# Patient Record
Sex: Male | Born: 1995 | Race: White | Hispanic: No | Marital: Married | Smoking: Never smoker
Health system: Southern US, Community
[De-identification: ages and names within clinical notes are randomized; demographics above are authoritative.]

## PROBLEM LIST (undated history)

## (undated) DIAGNOSIS — F909 Attention-deficit hyperactivity disorder, unspecified type: Secondary | ICD-10-CM

## (undated) HISTORY — DX: Attention-deficit hyperactivity disorder, unspecified type: F90.9

## (undated) HISTORY — PX: TONSILLECTOMY: SUR1361

---
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
## 2016-01-01 HISTORY — PX: WISDOM TOOTH EXTRACTION: SHX21

## 2019-11-18 ENCOUNTER — Other Ambulatory Visit: Payer: Self-pay

## 2019-11-18 DIAGNOSIS — Z20822 Contact with and (suspected) exposure to covid-19: Secondary | ICD-10-CM

## 2019-11-20 LAB — NOVEL CORONAVIRUS, NAA: SARS-CoV-2, NAA: NOT DETECTED

## 2020-09-21 ENCOUNTER — Ambulatory Visit: Payer: Self-pay

## 2022-08-27 ENCOUNTER — Other Ambulatory Visit: Payer: Self-pay | Admitting: Family Medicine

## 2022-08-27 ENCOUNTER — Encounter: Payer: Self-pay | Admitting: Family Medicine

## 2022-08-27 ENCOUNTER — Ambulatory Visit (INDEPENDENT_AMBULATORY_CARE_PROVIDER_SITE_OTHER): Payer: Managed Care, Other (non HMO) | Admitting: Family Medicine

## 2022-08-27 VITALS — BP 124/78 | HR 67 | Ht 71.0 in | Wt 205.6 lb

## 2022-08-27 DIAGNOSIS — Z7689 Persons encountering health services in other specified circumstances: Secondary | ICD-10-CM

## 2022-08-27 DIAGNOSIS — Z1159 Encounter for screening for other viral diseases: Secondary | ICD-10-CM

## 2022-08-27 DIAGNOSIS — Z833 Family history of diabetes mellitus: Secondary | ICD-10-CM | POA: Diagnosis not present

## 2022-08-27 DIAGNOSIS — Z13220 Encounter for screening for lipoid disorders: Secondary | ICD-10-CM

## 2022-08-27 DIAGNOSIS — Z83438 Family history of other disorder of lipoprotein metabolism and other lipidemia: Secondary | ICD-10-CM | POA: Diagnosis not present

## 2022-08-27 DIAGNOSIS — Z114 Encounter for screening for human immunodeficiency virus [HIV]: Secondary | ICD-10-CM

## 2022-08-27 DIAGNOSIS — E663 Overweight: Secondary | ICD-10-CM

## 2022-08-27 DIAGNOSIS — Z0000 Encounter for general adult medical examination without abnormal findings: Secondary | ICD-10-CM

## 2022-08-27 DIAGNOSIS — Z131 Encounter for screening for diabetes mellitus: Secondary | ICD-10-CM

## 2022-08-27 DIAGNOSIS — Z8249 Family history of ischemic heart disease and other diseases of the circulatory system: Secondary | ICD-10-CM

## 2022-08-27 NOTE — Patient Instructions (Addendum)
Thank you for coming to the office today.  DUE for FASTING BLOOD WORK (no food or drink after midnight before the lab appointment, only water or coffee without cream/sugar on the morning of)  SCHEDULE "Lab Only" visit in the morning at the clinic for lab draw in 4 WEEKS  - Make sure Lab Only appointment is at about 1 week before your next appointment, so that results will be available  For Lab Results, once available within 2-3 days of blood draw, you can can log in to MyChart online to view your results and a brief explanation. Also, we can discuss results at next follow-up visit.  Please schedule a Follow-up Appointment to: Return in about 4 weeks (around 09/24/2022) for 4 weeks fasting lab only then 1 week later Annual Physical.  If you have any other questions or concerns, please feel free to call the office or send a message through MyChart. You may also schedule an earlier appointment if necessary.  Additionally, you may be receiving a survey about your experience at our office within a few days to 1 week by e-mail or mail. We value your feedback.  Saralyn Pilar, DO [facility], [place]

## 2022-08-27 NOTE — Progress Notes (Signed)
Subjective:    Patient ID: Rodney Woods, male    DOB: April 17, 1996, 26 y.o.   MRN: [id number]  Rodney Woods is a 26 y.o. male presenting on 08/27/2022 for Establish Care  Establish care today [employer]  HPI  Lifestyle Diet and exercise, intermittent fasting, diet and added new exercise regimen, wt down from 243 down to 205 lbs. In past 1.5 months major improvement in exercise Plays golf regularly. Now increased cardio and running  Fam history DM HLD HTN CAD  Past History ADHD Age [age], previously treated and did well and this issue has resolved.   Health Maintenance:  COVID19 initial vaccine and booster.  TDap future, 1-2 years.       No data to display          Past Medical History:  Diagnosis Date   ADHD    Past Surgical History:  Procedure Laterality Date   TONSILLECTOMY     WISDOM TOOTH EXTRACTION  2017   Social History   Socioeconomic History   Marital status: Married    Spouse name: Not on file   Number of children: Not on file   Years of education: Not on file   Highest education level: Not on file  Occupational History   Not on file  Tobacco Use   Smoking status: Never   Smokeless tobacco: Never  Vaping Use   Vaping Use: Never used  Substance and Sexual Activity   Alcohol use: Yes    Alcohol/week: 1.0 standard drink of alcohol    Types: 1 Standard drinks or equivalent per week   Drug use: Never   Sexual activity: Not on file  Other Topics Concern   Not on file  Social History Narrative   Not on file   Social Determinants of Health   Financial Resource Strain: Not on file  Food Insecurity: Not on file  Transportation Needs: Not on file  Physical Activity: Not on file  Stress: Not on file  Social Connections: Not on file  Intimate Partner Violence: Not on file   Family History  Problem Relation Age of Onset   Hyperlipidemia Mother    Hypertension Mother    Hyperlipidemia Father    Diabetes Father    Heart disease  Father    Valvular heart disease Maternal Grandmother    Heart disease Maternal Grandfather    Alzheimer's disease Paternal Grandfather    Cancer - Colon Paternal Grandfather 38   Cancer - Prostate Neg Hx    Current Outpatient Medications on File Prior to Visit  Medication Sig   Multiple Vitamin (MULTIVITAMIN) capsule Take 1 capsule by mouth daily.   No current facility-administered medications on file prior to visit.    Review of Systems Per HPI unless specifically indicated above      Objective:    BP 124/78   Pulse 67   Ht 5\' 11"  (1.803 m)   Wt 205 lb 9.6 oz (93.3 kg)   SpO2 100%   BMI 28.68 kg/m   Wt Readings from Last 3 Encounters:  08/27/22 205 lb 9.6 oz (93.3 kg)    Physical Exam Vitals and nursing note reviewed.  Constitutional:      General: He is not in acute distress.    Appearance: Normal appearance. He is well-developed. He is not diaphoretic.     Comments: Well-appearing, comfortable, cooperative  HENT:     Head: Normocephalic and atraumatic.  Eyes:     General:  Right eye: No discharge.        Left eye: No discharge.     Conjunctiva/sclera: Conjunctivae normal.  Cardiovascular:     Rate and Rhythm: Normal rate.  Pulmonary:     Effort: Pulmonary effort is normal.  Skin:    General: Skin is warm and dry.     Findings: No erythema or rash.  Neurological:     Mental Status: He is alert and oriented to person, place, and time.  Psychiatric:        Mood and Affect: Mood normal.        Behavior: Behavior normal.        Thought Content: Thought content normal.     Comments: Well groomed, good eye contact, normal speech and thoughts      Results for orders placed or performed in visit on 11/18/19  Novel Coronavirus, NAA ([facility])   Specimen: Nasopharyngeal(NP) swabs in vial transport medium   NASOPHARYNGE  TESTING  Result Value Ref Range   SARS-CoV-2, NAA Not Detected Not Detected      Assessment & Plan:   Problem List Items  Addressed This Visit   None Visit Diagnoses     Overweight (BMI 25.0-29.9)    -  Primary   Encounter to establish care with new doctor       Family history of hyperlipidemia       Family history of diabetes mellitus (DM)       Family history of heart disease           Establish care today No significant medical history Age 39, due for upcoming yearly annual and screening Strong fam history of diabetes, HLD, HTN, Heart disease Personal history of ADHD mild, childhood only now resolved. Not on medications Significant lifestyle improvement with diet exercise, wt loss >40 lbs Encourage continue lifestyle Labs upcoming 4-6 weeks and follow-up  Additionally discussed male fertility testing, we could arrange when ready with basic semen analysis. He is trying to conceive child for past 11+ months.  No orders of the defined types were placed in this encounter.    Follow up plan: Return in about 4 weeks (around 09/24/2022) for 4 weeks fasting lab only then 1 week later Annual Physical.  Future labs  CMET, Lipid, CBC, A1c,  HIV Hep C screening   Saralyn Pilar, DO [facility] 08/27/2022, 10:12 AM

## 2022-10-05 ENCOUNTER — Other Ambulatory Visit: Payer: Self-pay

## 2022-10-05 DIAGNOSIS — Z0000 Encounter for general adult medical examination without abnormal findings: Secondary | ICD-10-CM

## 2022-10-05 DIAGNOSIS — Z833 Family history of diabetes mellitus: Secondary | ICD-10-CM

## 2022-10-05 DIAGNOSIS — Z83438 Family history of other disorder of lipoprotein metabolism and other lipidemia: Secondary | ICD-10-CM

## 2022-10-05 DIAGNOSIS — Z13220 Encounter for screening for lipoid disorders: Secondary | ICD-10-CM

## 2022-10-05 DIAGNOSIS — Z114 Encounter for screening for human immunodeficiency virus [HIV]: Secondary | ICD-10-CM

## 2022-10-05 DIAGNOSIS — Z1159 Encounter for screening for other viral diseases: Secondary | ICD-10-CM

## 2022-10-05 DIAGNOSIS — Z131 Encounter for screening for diabetes mellitus: Secondary | ICD-10-CM

## 2022-10-08 ENCOUNTER — Other Ambulatory Visit: Payer: Managed Care, Other (non HMO)

## 2022-10-09 LAB — COMPLETE METABOLIC PANEL WITH GFR
AG Ratio: 1.8 (calc) (ref 1.0–2.5)
ALT: 24 U/L (ref 9–46)
AST: 20 U/L (ref 10–40)
Albumin: 4.6 g/dL (ref 3.6–5.1)
Alkaline phosphatase (APISO): 63 U/L (ref 36–130)
BUN: 12 mg/dL (ref 7–25)
CO2: 25 mmol/L (ref 20–32)
Calcium: 9.8 mg/dL (ref 8.6–10.3)
Chloride: 109 mmol/L (ref 98–110)
Creat: 0.89 mg/dL (ref 0.60–1.24)
Globulin: 2.5 g/dL (calc) (ref 1.9–3.7)
Glucose, Bld: 89 mg/dL (ref 65–99)
Potassium: 4.2 mmol/L (ref 3.5–5.3)
Sodium: 143 mmol/L (ref 135–146)
Total Bilirubin: 0.3 mg/dL (ref 0.2–1.2)
Total Protein: 7.1 g/dL (ref 6.1–8.1)
eGFR: 120 mL/min/{1.73_m2} (ref >=60)

## 2022-10-09 LAB — LIPID PANEL
Cholesterol: 125 mg/dL (ref <200)
HDL: 47 mg/dL (ref >=40)
LDL Cholesterol (Calc): 65 mg/dL (calc)
Non-HDL Cholesterol (Calc): 78 mg/dL (calc) (ref <130)
Total CHOL/HDL Ratio: 2.7 (calc) (ref <5.0)
Triglycerides: 52 mg/dL (ref <150)

## 2022-10-09 LAB — CBC WITH DIFFERENTIAL/PLATELET
Absolute Monocytes: 632 cells/uL (ref 200–950)
Basophils Absolute: 48 cells/uL (ref 0–200)
Basophils Relative: 0.6 %
Eosinophils Absolute: 136 cells/uL (ref 15–500)
Eosinophils Relative: 1.7 %
HCT: 44.3 % (ref 38.5–50.0)
Hemoglobin: 15 g/dL (ref 13.2–17.1)
Lymphs Abs: 2616 cells/uL (ref 850–3900)
MCH: 26.8 pg — ABNORMAL LOW (ref 27.0–33.0)
MCHC: 33.9 g/dL (ref 32.0–36.0)
MCV: 79.1 fL — ABNORMAL LOW (ref 80.0–100.0)
MPV: 11.4 fL (ref 7.5–12.5)
Monocytes Relative: 7.9 %
Neutro Abs: 4568 cells/uL (ref 1500–7800)
Neutrophils Relative %: 57.1 %
Platelets: 270 10*3/uL (ref 140–400)
RBC: 5.6 10*6/uL (ref 4.20–5.80)
RDW: 13.9 % (ref 11.0–15.0)
Total Lymphocyte: 32.7 %
WBC: 8 10*3/uL (ref 3.8–10.8)

## 2022-10-09 LAB — HEMOGLOBIN A1C
Hgb A1c MFr Bld: 5.4 % of total Hgb (ref <5.7)
Mean Plasma Glucose: 108 mg/dL
eAG (mmol/L): 6 mmol/L

## 2022-10-09 LAB — HIV ANTIBODY (ROUTINE TESTING W REFLEX): HIV 1&2 Ab, 4th Generation: NONREACTIVE

## 2022-10-09 LAB — HEPATITIS C ANTIBODY: Hepatitis C Ab: NONREACTIVE

## 2022-10-15 ENCOUNTER — Other Ambulatory Visit: Payer: Self-pay | Admitting: Family Medicine

## 2022-10-15 ENCOUNTER — Encounter: Payer: Self-pay | Admitting: Family Medicine

## 2022-10-15 ENCOUNTER — Ambulatory Visit (INDEPENDENT_AMBULATORY_CARE_PROVIDER_SITE_OTHER): Payer: Managed Care, Other (non HMO) | Admitting: Family Medicine

## 2022-10-15 VITALS — BP 124/70 | HR 65 | Ht 71.0 in | Wt 198.2 lb

## 2022-10-15 DIAGNOSIS — Z0000 Encounter for general adult medical examination without abnormal findings: Secondary | ICD-10-CM

## 2022-10-15 DIAGNOSIS — Z131 Encounter for screening for diabetes mellitus: Secondary | ICD-10-CM

## 2022-10-15 DIAGNOSIS — Z833 Family history of diabetes mellitus: Secondary | ICD-10-CM

## 2022-10-15 DIAGNOSIS — R7989 Other specified abnormal findings of blood chemistry: Secondary | ICD-10-CM

## 2022-10-15 DIAGNOSIS — Z13220 Encounter for screening for lipoid disorders: Secondary | ICD-10-CM

## 2022-10-15 DIAGNOSIS — Z83438 Family history of other disorder of lipoprotein metabolism and other lipidemia: Secondary | ICD-10-CM

## 2022-10-15 NOTE — Patient Instructions (Addendum)
Thank you for coming to the office today.  Excellent lab test results.  Only mild issue were the 2 measurements on the Blood Count, these are not a concern at all. Just a baseline measurement for your blood cells. You are not anemic. We will check again yearly for this and monitor.  Let me know on a mychart message or call if/when you are at this point to do the semen analysis. We would order the test and then you can pick up the specimen cups and the paperwork if needed  Basic Semen Analysis is a sperm test to determine if sperm are present and viable. This can be used as an initial fertility test for men.  - If you would like to check the cost and coverage of the test, you may contact your insurance provider to determine your cost. If you would like to proceed, then notify our office to proceed with the order.  - Information to give to insurance to determine cost: - "Semen Analysis, Basic" - Test Code/#: 170017 - CPT Code: [phone]  - To proceed with Semen Analysis testing please follow these instructions: - You will need the following items from our office:    1) Sterile specimen cup / container, within a specimen bag    2) Collection Instructions Form - from [facility]    3) Released order from our office, printed  - The test must be scheduled at least 1 week in advance. They can schedule you weeks or months in advance. - First, read the "Collection Instructions" closely - ask any questions you have when you call to schedule - You need to schedule your specimen drop off appointment with [facility]: Call [phone] (Semen Analysis Scheduling)     (Only 1 [place] office locally [facility], [place]) offers this test. If you are outside area, [facility] can locate a different office for you) - Pick your drop off date and time. They offer AM and Afternoon times. The specimen will need to be collected and then dropped off within 1 hour of collection following their specific instructions. - You should  be abstinent / without ejaculation by any means at least 2 to 7 days prior to the specimen collection - On the day of your collection, before you collect your sample, complete the "bottom half of the Information Form" and bring this with your specimen for drop off  Additionally, if further questions regarding interpretation of semen analysis remain, we may consult Urology Specialist. Or if additional fertility testing is required, we will often assist in referring to a [facility].    DUE for FASTING BLOOD WORK (no food or drink after midnight before the lab appointment, only water or coffee without cream/sugar on the morning of)  SCHEDULE "Lab Only" visit in the morning at the clinic for lab draw in 1 YEAR  - Make sure Lab Only appointment is at about 1 week before your next appointment, so that results will be available  For Lab Results, once available within 2-3 days of blood draw, you can can log in to MyChart online to view your results and a brief explanation. Also, we can discuss results at next follow-up visit.  Please schedule a Follow-up Appointment to: Return in about 1 year (around 10/16/2023) for 1 year fasting lab only then 1 week later Annual Physical.  If you have any other questions or concerns, please feel free to call the office or send a message through [place]. You may also schedule an earlier appointment if  necessary.  Additionally, you may be receiving a survey about your experience at our office within a few days to 1 week by e-mail or mail. We value your feedback.  Nobie Putnam, DO [place]

## 2022-10-15 NOTE — Progress Notes (Signed)
Subjective:    Patient ID: Rodney Woods, male    DOB: 1996-10-13, 26 y.o.   MRN: [id number]  Rodney Woods is a 26 y.o. male presenting on 10/15/2022 for Annual Exam   HPI  Here for Annual Physical and Lab Review  Lifestyle Continues improvements with lifestyle. Diet and exercise, intermittent fasting, diet and exercise regimen, wt down from 243 down to 205 lbs over past 2+ months, and now down to 198 lbs Plays golf regularly. Now increased cardio and running  Lab review without abnormal results. Reassuring results for A1c, 5.4 and Lipid panel normal range.  Mild abnormal CBC with MCV MCH  Fam history DM HLD HTN CAD   Past History ADHD Age [age], previously treated and did well and this issue has resolved.  Fertility questions - they have been trying to conceive for 12 months, wife is doing some testing for ovulation and then if needed, he would notify us for basic semen analysis next    Health Maintenance:   COVID19 initial vaccine and booster.   TDap future, 1-2 years.  Flu Shot declined       No data to display          Past Medical History:  Diagnosis Date   ADHD    Past Surgical History:  Procedure Laterality Date   TONSILLECTOMY     WISDOM TOOTH EXTRACTION  2017   Social History   Socioeconomic History   Marital status: Married    Spouse name: Not on file   Number of children: Not on file   Years of education: Not on file   Highest education level: Not on file  Occupational History   Not on file  Tobacco Use   Smoking status: Never   Smokeless tobacco: Never  Vaping Use   Vaping Use: Never used  Substance and Sexual Activity   Alcohol use: Yes    Alcohol/week: 1.0 standard drink of alcohol    Types: 1 Standard drinks or equivalent per week   Drug use: Never   Sexual activity: Not on file  Other Topics Concern   Not on file  Social History Narrative   Not on file   Social Determinants of Health   Financial Resource Strain: Not  on file  Food Insecurity: Not on file  Transportation Needs: Not on file  Physical Activity: Not on file  Stress: Not on file  Social Connections: Not on file  Intimate Partner Violence: Not on file   Family History  Problem Relation Age of Onset   Hyperlipidemia Mother    Hypertension Mother    Hyperlipidemia Father    Diabetes Father    Heart disease Father    Valvular heart disease Maternal Grandmother    Heart disease Maternal Grandfather    Alzheimer's disease Paternal Grandfather    Cancer - Colon Paternal Grandfather 4   Cancer - Prostate Neg Hx    Current Outpatient Medications on File Prior to Visit  Medication Sig   Multiple Vitamin (MULTIVITAMIN) capsule Take 1 capsule by mouth daily.   No current facility-administered medications on file prior to visit.    Review of Systems  Constitutional:  Negative for activity change, appetite change, chills, diaphoresis, fatigue and fever.  HENT:  Negative for congestion and hearing loss.   Eyes:  Negative for visual disturbance.  Respiratory:  Negative for cough, chest tightness, shortness of breath and wheezing.   Cardiovascular:  Negative for chest pain, palpitations and leg swelling.  Gastrointestinal:  Negative for abdominal pain, constipation, diarrhea, nausea and vomiting.  Genitourinary:  Negative for dysuria, frequency and hematuria.  Musculoskeletal:  Negative for arthralgias and neck pain.  Skin:  Negative for rash.  Neurological:  Negative for dizziness, weakness, light-headedness, numbness and headaches.  Hematological:  Negative for adenopathy.  Psychiatric/Behavioral:  Negative for behavioral problems, dysphoric mood and sleep disturbance.    Per HPI unless specifically indicated above      Objective:    BP 124/70   Pulse 65   Ht 5\' 11"  (1.803 m)   Wt 198 lb 3.2 oz (89.9 kg)   SpO2 99%   BMI 27.64 kg/m   Wt Readings from Last 3 Encounters:  10/15/22 198 lb 3.2 oz (89.9 kg)  08/27/22 205 lb 9.6  oz (93.3 kg)    Physical Exam Vitals and nursing note reviewed.  Constitutional:      General: He is not in acute distress.    Appearance: He is well-developed. He is not diaphoretic.     Comments: Well-appearing, comfortable, cooperative  HENT:     Head: Normocephalic and atraumatic.  Eyes:     General:        Right eye: No discharge.        Left eye: No discharge.     Conjunctiva/sclera: Conjunctivae normal.     Pupils: Pupils are equal, round, and reactive to light.  Neck:     Thyroid: No thyromegaly.  Cardiovascular:     Rate and Rhythm: Normal rate and regular rhythm.     Pulses: Normal pulses.     Heart sounds: Normal heart sounds. No murmur heard. Pulmonary:     Effort: Pulmonary effort is normal. No respiratory distress.     Breath sounds: Normal breath sounds. No wheezing or rales.  Abdominal:     General: Bowel sounds are normal. There is no distension.     Palpations: Abdomen is soft. There is no mass.     Tenderness: There is no abdominal tenderness.  Musculoskeletal:        General: No tenderness. Normal range of motion.     Cervical back: Normal range of motion and neck supple.     Right lower leg: No edema.     Left lower leg: No edema.     Comments: Upper / Lower Extremities: - Normal muscle tone, strength bilateral upper extremities 5/5, lower extremities 5/5  Lymphadenopathy:     Cervical: No cervical adenopathy.  Skin:    General: Skin is warm and dry.     Findings: No erythema or rash.  Neurological:     Mental Status: He is alert and oriented to person, place, and time.     Comments: Distal sensation intact to light touch all extremities  Psychiatric:        Mood and Affect: Mood normal.        Behavior: Behavior normal.        Thought Content: Thought content normal.     Comments: Well groomed, good eye contact, normal speech and thoughts       Results for orders placed or performed in visit on 10/05/22  HIV Antibody (routine testing w  rflx)  Result Value Ref Range   HIV 1&2 Ab, 4th Generation NON-REACTIVE NON-REACTIVE  Hepatitis C antibody  Result Value Ref Range   Hepatitis C Ab NON-REACTIVE NON-REACTIVE  Hemoglobin A1c  Result Value Ref Range   Hgb A1c MFr Bld 5.4 <5.7 % of total Hgb  Mean Plasma Glucose 108 mg/dL   eAG (mmol/L) 6.0 mmol/L  Lipid panel  Result Value Ref Range   Cholesterol 125 <200 mg/dL   HDL 47 > OR = 40 mg/dL   Triglycerides 52 <150 mg/dL   LDL Cholesterol (Calc) 65 mg/dL (calc)   Total CHOL/HDL Ratio 2.7 <5.0 (calc)   Non-HDL Cholesterol (Calc) 78 <130 mg/dL (calc)  CBC with Differential/Platelet  Result Value Ref Range   WBC 8.0 3.8 - 10.8 Thousand/uL   RBC 5.60 4.20 - 5.80 Million/uL   Hemoglobin 15.0 13.2 - 17.1 g/dL   HCT 44.3 38.5 - 50.0 %   MCV 79.1 (L) 80.0 - 100.0 fL   MCH 26.8 (L) 27.0 - 33.0 pg   MCHC 33.9 32.0 - 36.0 g/dL   RDW 13.9 11.0 - 15.0 %   Platelets 270 140 - 400 Thousand/uL   MPV 11.4 7.5 - 12.5 fL   Neutro Abs 4,568 1,500 - 7,800 cells/uL   Lymphs Abs 2,616 850 - 3,900 cells/uL   Absolute Monocytes 632 200 - 950 cells/uL   Eosinophils Absolute 136 15 - 500 cells/uL   Basophils Absolute 48 0 - 200 cells/uL   Neutrophils Relative % 57.1 %   Total Lymphocyte 32.7 %   Monocytes Relative 7.9 %   Eosinophils Relative 1.7 %   Basophils Relative 0.6 %  COMPLETE METABOLIC PANEL WITH GFR  Result Value Ref Range   Glucose, Bld 89 65 - 99 mg/dL   BUN 12 7 - 25 mg/dL   Creat 0.89 0.60 - 1.24 mg/dL   eGFR 120 > OR = 60 mL/min/1.42m2   BUN/Creatinine Ratio SEE NOTE: 6 - 22 (calc)   Sodium 143 135 - 146 mmol/L   Potassium 4.2 3.5 - 5.3 mmol/L   Chloride 109 98 - 110 mmol/L   CO2 25 20 - 32 mmol/L   Calcium 9.8 8.6 - 10.3 mg/dL   Total Protein 7.1 6.1 - 8.1 g/dL   Albumin 4.6 3.6 - 5.1 g/dL   Globulin 2.5 1.9 - 3.7 g/dL (calc)   AG Ratio 1.8 1.0 - 2.5 (calc)   Total Bilirubin 0.3 0.2 - 1.2 mg/dL   Alkaline phosphatase (APISO) 63 36 - 130 U/L   AST 20 10 - 40  U/L   ALT 24 9 - 46 U/L      Assessment & Plan:   Problem List Items Addressed This Visit   None Visit Diagnoses     Annual physical exam    -  Primary       Updated Health Maintenance information Reviewed recent lab results with patient All labs reassuring w/ his family history Minimal CBC abnormality not a concern Encouraged improvement to lifestyle with diet and exercise Goal continue improved weight loss  He declines vaccines today  No orders of the defined types were placed in this encounter.     Follow up plan: Return in about 1 year (around 10/16/2023) for 1 year fasting lab only then 1 week later Annual Physical.  Future labs ordered 10/14/23  Nobie Putnam, [facility] 10/15/2022, 11:07 AM

## 2023-05-08 ENCOUNTER — Encounter (INDEPENDENT_AMBULATORY_CARE_PROVIDER_SITE_OTHER): Payer: Managed Care, Other (non HMO) | Admitting: Family Medicine

## 2023-05-08 DIAGNOSIS — Z3141 Encounter for fertility testing: Secondary | ICD-10-CM

## 2023-05-08 NOTE — Telephone Encounter (Signed)
Please see the MyChart message reply(ies) for my assessment and plan.    This patient gave consent for this Medical Advice Message and is aware that it may result in a bill to their insurance company, as well as the possibility of receiving a bill for a co-payment or deductible. They are an established patient, but are not seeking medical advice exclusively about a problem treated during an in person or video visit in the last seven days. I did not recommend an in person or video visit within seven days of my reply.    I spent a total of 7 minutes cumulative time within 7 days through MyChart messaging.  Nakshatra Klose, DO   

## 2023-06-24 ENCOUNTER — Other Ambulatory Visit: Payer: Self-pay

## 2023-06-24 DIAGNOSIS — Z3141 Encounter for fertility testing: Secondary | ICD-10-CM

## 2023-06-26 LAB — SEMEN ANALYSIS, BASIC
Appearance: NORMAL
Concentration, Sperm: 54.6 x10E6/mL (ref >14.9)
Immotile Sperm: 49 %
Leukocyte Concentration: <1 x10E6/mL (ref <1.00)
Non-Progressive (NP): 15 %
Normal Morphology-Strict: 9 % (ref >3)
Progressive Motility (PR): 36 % (ref >31)
Progressively Motile Sperm: 39.5 x10E6
Time Collected: 1426
Time Received: 1500
Time Since Last Emission: 3 days
Total Motile Sperm: 55.2 x10E6
Total Motility (PR+NP): 51 % (ref >39)
Total Sperm in Ejaculate: 109.2 x10E6 (ref >38.9)
Volume: 2 mL (ref >1.4)
pH: 8.5 (ref >7.1)

## 2023-10-14 ENCOUNTER — Other Ambulatory Visit: Payer: Managed Care, Other (non HMO)

## 2023-10-14 DIAGNOSIS — Z833 Family history of diabetes mellitus: Secondary | ICD-10-CM

## 2023-10-14 DIAGNOSIS — Z0000 Encounter for general adult medical examination without abnormal findings: Secondary | ICD-10-CM

## 2023-10-14 DIAGNOSIS — Z13220 Encounter for screening for lipoid disorders: Secondary | ICD-10-CM

## 2023-10-14 DIAGNOSIS — Z131 Encounter for screening for diabetes mellitus: Secondary | ICD-10-CM

## 2023-10-14 DIAGNOSIS — Z83438 Family history of other disorder of lipoprotein metabolism and other lipidemia: Secondary | ICD-10-CM

## 2023-10-14 DIAGNOSIS — R7989 Other specified abnormal findings of blood chemistry: Secondary | ICD-10-CM

## 2023-10-15 LAB — COMPLETE METABOLIC PANEL WITH GFR
AG Ratio: 2 (calc) (ref 1.0–2.5)
ALT: 20 U/L (ref 9–46)
AST: 14 U/L (ref 10–40)
Albumin: 4.9 g/dL (ref 3.6–5.1)
Alkaline phosphatase (APISO): 60 U/L (ref 36–130)
BUN: 17 mg/dL (ref 7–25)
CO2: 23 mmol/L (ref 20–32)
Calcium: 9.9 mg/dL (ref 8.6–10.3)
Chloride: 104 mmol/L (ref 98–110)
Creat: 1.01 mg/dL (ref 0.60–1.24)
Globulin: 2.5 g/dL (ref 1.9–3.7)
Glucose, Bld: 82 mg/dL (ref 65–99)
Potassium: 4.4 mmol/L (ref 3.5–5.3)
Sodium: 139 mmol/L (ref 135–146)
Total Bilirubin: 0.6 mg/dL (ref 0.2–1.2)
Total Protein: 7.4 g/dL (ref 6.1–8.1)
eGFR: 103 mL/min/{1.73_m2} (ref >=60)

## 2023-10-15 LAB — CBC WITH DIFFERENTIAL/PLATELET
Absolute Monocytes: 543 {cells}/uL (ref 200–950)
Basophils Absolute: 49 {cells}/uL (ref 0–200)
Basophils Relative: 0.8 %
Eosinophils Absolute: 98 {cells}/uL (ref 15–500)
Eosinophils Relative: 1.6 %
HCT: 46.5 % (ref 38.5–50.0)
Hemoglobin: 15.2 g/dL (ref 13.2–17.1)
Lymphs Abs: 1879 {cells}/uL (ref 850–3900)
MCH: 26.5 pg — ABNORMAL LOW (ref 27.0–33.0)
MCHC: 32.7 g/dL (ref 32.0–36.0)
MCV: 81 fL (ref 80.0–100.0)
MPV: 10.6 fL (ref 7.5–12.5)
Monocytes Relative: 8.9 %
Neutro Abs: 3532 {cells}/uL (ref 1500–7800)
Neutrophils Relative %: 57.9 %
Platelets: 265 10*3/uL (ref 140–400)
RBC: 5.74 10*6/uL (ref 4.20–5.80)
RDW: 13.9 % (ref 11.0–15.0)
Total Lymphocyte: 30.8 %
WBC: 6.1 10*3/uL (ref 3.8–10.8)

## 2023-10-15 LAB — LIPID PANEL
Cholesterol: 145 mg/dL (ref <200)
HDL: 44 mg/dL (ref >=40)
LDL Cholesterol (Calc): 84 mg/dL
Non-HDL Cholesterol (Calc): 101 mg/dL (ref <130)
Total CHOL/HDL Ratio: 3.3 (calc) (ref <5.0)
Triglycerides: 79 mg/dL (ref <150)

## 2023-10-15 LAB — HEMOGLOBIN A1C
Hgb A1c MFr Bld: 5.5 %{Hb} (ref <5.7)
Mean Plasma Glucose: 111 mg/dL
eAG (mmol/L): 6.2 mmol/L

## 2023-10-21 ENCOUNTER — Encounter: Payer: Self-pay | Admitting: Family Medicine

## 2023-10-21 ENCOUNTER — Ambulatory Visit (INDEPENDENT_AMBULATORY_CARE_PROVIDER_SITE_OTHER): Payer: Managed Care, Other (non HMO) | Admitting: Family Medicine

## 2023-10-21 VITALS — BP 134/80 | HR 79 | Ht 70.0 in | Wt 221.0 lb

## 2023-10-21 DIAGNOSIS — Z0000 Encounter for general adult medical examination without abnormal findings: Secondary | ICD-10-CM | POA: Diagnosis not present

## 2023-10-21 NOTE — Patient Instructions (Addendum)
Thank you for coming to the office today.  Keep improving diet and lifestyle, get back on track with healthy diet and exercise.  Elevated cholesterol showed LDL elevated to 80s   DUE for FASTING BLOOD WORK (no food or drink after midnight before the lab appointment, only water or coffee without cream/sugar on the morning of)  SCHEDULE "Lab Only" visit in the morning at the clinic for lab draw in 1 YEAR  - Make sure Lab Only appointment is at about 1 week before your next appointment, so that results will be available  For Lab Results, once available within 2-3 days of blood draw, you can can log in to MyChart online to view your results and a brief explanation. Also, we can discuss results at next follow-up visit.   Please schedule a Follow-up Appointment to: Return in about 1 year (around 10/20/2024) for 1 year fasting lab only then 1 week later Annual Physical.  If you have any other questions or concerns, please feel free to call the office or send a message through MyChart. You may also schedule an earlier appointment if necessary.  Additionally, you may be receiving a survey about your experience at our office within a few days to 1 week by e-mail or mail. We value your feedback.  Saralyn Pilar, DO [facility], [place]

## 2023-10-21 NOTE — Progress Notes (Signed)
Subjective:    Patient ID: Rodney Woods, male    DOB: 1996/10/05, 27 y.o.   MRN: [id number]  Rodney Woods is a 27 y.o. male presenting on 10/21/2023 for Annual Exam   HPI  Here for Annual Physical and Lab Review   Discussed the use of AI scribe software for clinical note transcription with the patient, who gave verbal consent to proceed.       Lifestyle Continues improvements with lifestyle. Diet and exercise, intermittent fasting, diet and exercise regimen weight gain of approximately 20 pounds since the previous year. Plays golf regularly. Now increased cardio and running  Elevated LDL to 80s from previous 60 - it is still normal The patient's cholesterol levels had increased slightly compared to the previous year, which he attributed to a less healthy diet. Despite the increase, the cholesterol levels remained within a healthy range. The patient expressed a desire to improve his diet and return to his previous healthier lifestyle.   Mild abnormal CBC with MCH, similar to prior   Fam history DM HLD HTN CAD   Past History ADHD Age 14-15, previously treated and did well and this issue has resolved.   Fertility questions - he and his wife have been attempting to conceive for >12+ months, he has done basic semen analysis testing from our office back in June 2024 and it was normal or negative. He request copy of results and his wife has done testing as well and now they are going to pursue fertility clinic  Additional topic  L Ankle injury Recently sustained an ankle injury, accidental injury with golf club, hit his ankle on his swing, which had temporarily limited his physical activity. The injury, a bone bruise on the left ankle, had been evaluated at an emergency orthopedic clinic. No fractures were found, and the patient was advised to rest, ice, compress, and elevate (RICE) the ankle. - Significantly improved each day now   Health Maintenance:   Flu Shot declined        10/21/2023   10:50 AM  Depression screen PHQ 2/9  Decreased Interest 0  Down, Depressed, Hopeless 0  PHQ - 2 Score 0  Altered sleeping 0  Tired, decreased energy 0  Change in appetite 0  Feeling bad or failure about yourself  0  Trouble concentrating 0  Moving slowly or fidgety/restless 0  Suicidal thoughts 0  PHQ-9 Score 0  Difficult doing work/chores Not difficult at all    Past Medical History:  Diagnosis Date   ADHD    Past Surgical History:  Procedure Laterality Date   TONSILLECTOMY     WISDOM TOOTH EXTRACTION  2017   Social History   Socioeconomic History   Marital status: Married    Spouse name: Not on file   Number of children: Not on file   Years of education: Not on file   Highest education level: Not on file  Occupational History   Not on file  Tobacco Use   Smoking status: Never   Smokeless tobacco: Never   Tobacco comments:    Never smoked  Vaping Use   Vaping status: Never Used  Substance and Sexual Activity   Alcohol use: Yes    Alcohol/week: 1.0 standard drink of alcohol    Types: 1 Standard drinks or equivalent per week   Drug use: Never   Sexual activity: Yes    Birth control/protection: None  Other Topics Concern   Not on file  Social History Narrative  Not on file   Social Determinants of Health   Financial Resource Strain: Not on file  Food Insecurity: Not on file  Transportation Needs: Not on file  Physical Activity: Not on file  Stress: Not on file  Social Connections: Not on file  Intimate Partner Violence: Not on file   Family History  Problem Relation Age of Onset   Hyperlipidemia Mother    Hypertension Mother    Hyperlipidemia Father    Diabetes Father    Heart disease Father    Valvular heart disease Maternal Grandmother    Heart disease Maternal Grandfather    Alzheimer's disease Paternal Grandfather    Cancer - Colon Paternal Grandfather 23   Cancer - Prostate Neg Hx    Current Outpatient Medications  on File Prior to Visit  Medication Sig   Multiple Vitamin (MULTIVITAMIN) capsule Take 1 capsule by mouth daily. (Patient not taking: Reported on 10/21/2023)   No current facility-administered medications on file prior to visit.    Review of Systems  Constitutional:  Negative for activity change, appetite change, chills, diaphoresis, fatigue and fever.  HENT:  Negative for congestion and hearing loss.   Eyes:  Negative for visual disturbance.  Respiratory:  Negative for cough, chest tightness, shortness of breath and wheezing.   Cardiovascular:  Negative for chest pain, palpitations and leg swelling.  Gastrointestinal:  Negative for abdominal pain, constipation, diarrhea, nausea and vomiting.  Genitourinary:  Negative for dysuria, frequency and hematuria.  Musculoskeletal:  Negative for arthralgias and neck pain.  Skin:  Negative for rash.  Neurological:  Negative for dizziness, weakness, light-headedness, numbness and headaches.  Hematological:  Negative for adenopathy.  Psychiatric/Behavioral:  Negative for behavioral problems, dysphoric mood and sleep disturbance.    Per HPI unless specifically indicated above     Objective:    BP 134/80   Pulse 79   Ht 5\' 10"  (1.778 m)   Wt 221 lb (100.2 kg)   SpO2 97%   BMI 31.71 kg/m   Wt Readings from Last 3 Encounters:  10/21/23 221 lb (100.2 kg)  10/15/22 198 lb 3.2 oz (89.9 kg)  08/27/22 205 lb 9.6 oz (93.3 kg)    Physical Exam Vitals and nursing note reviewed.  Constitutional:      General: He is not in acute distress.    Appearance: He is well-developed. He is not diaphoretic.     Comments: Well-appearing, comfortable, cooperative  HENT:     Head: Normocephalic and atraumatic.  Eyes:     General:        Right eye: No discharge.        Left eye: No discharge.     Conjunctiva/sclera: Conjunctivae normal.     Pupils: Pupils are equal, round, and reactive to light.  Neck:     Thyroid: No thyromegaly.  Cardiovascular:      Rate and Rhythm: Normal rate and regular rhythm.     Pulses: Normal pulses.     Heart sounds: Normal heart sounds. No murmur heard. Pulmonary:     Effort: Pulmonary effort is normal. No respiratory distress.     Breath sounds: Normal breath sounds. No wheezing or rales.  Abdominal:     General: Bowel sounds are normal. There is no distension.     Palpations: Abdomen is soft. There is no mass.     Tenderness: There is no abdominal tenderness.  Musculoskeletal:        General: No tenderness. Normal range of motion.  Cervical back: Normal range of motion and neck supple.     Right lower leg: No edema.     Left lower leg: No edema.     Comments: Upper / Lower Extremities: - Normal muscle tone, strength bilateral upper extremities 5/5, lower extremities 5/5  Lymphadenopathy:     Cervical: No cervical adenopathy.  Skin:    General: Skin is warm and dry.     Findings: No erythema or rash.  Neurological:     Mental Status: He is alert and oriented to person, place, and time.     Comments: Distal sensation intact to light touch all extremities  Psychiatric:        Mood and Affect: Mood normal.        Behavior: Behavior normal.        Thought Content: Thought content normal.     Comments: Well groomed, good eye contact, normal speech and thoughts      Results for orders placed or performed in visit on 10/14/23  Hemoglobin A1c  Result Value Ref Range   Hgb A1c MFr Bld 5.5 <5.7 % of total Hgb   Mean Plasma Glucose 111 mg/dL   eAG (mmol/L) 6.2 mmol/L  Lipid panel  Result Value Ref Range   Cholesterol 145 <200 mg/dL   HDL 44 > OR = 40 mg/dL   Triglycerides 79 <161 mg/dL   LDL Cholesterol (Calc) 84 mg/dL (calc)   Total CHOL/HDL Ratio 3.3 <5.0 (calc)   Non-HDL Cholesterol (Calc) 101 <130 mg/dL (calc)  CBC with Differential/Platelet  Result Value Ref Range   WBC 6.1 3.8 - 10.8 Thousand/uL   RBC 5.74 4.20 - 5.80 Million/uL   Hemoglobin 15.2 13.2 - 17.1 g/dL   HCT 09.6 04.5 -  40.9 %   MCV 81.0 80.0 - 100.0 fL   MCH 26.5 (L) 27.0 - 33.0 pg   MCHC 32.7 32.0 - 36.0 g/dL   RDW 81.1 91.4 - 78.2 %   Platelets 265 140 - 400 Thousand/uL   MPV 10.6 7.5 - 12.5 fL   Neutro Abs 3,532 1,500 - 7,800 cells/uL   Lymphs Abs 1,879 850 - 3,900 cells/uL   Absolute Monocytes 543 200 - 950 cells/uL   Eosinophils Absolute 98 15 - 500 cells/uL   Basophils Absolute 49 0 - 200 cells/uL   Neutrophils Relative % 57.9 %   Total Lymphocyte 30.8 %   Monocytes Relative 8.9 %   Eosinophils Relative 1.6 %   Basophils Relative 0.8 %  COMPLETE METABOLIC PANEL WITH GFR  Result Value Ref Range   Glucose, Bld 82 65 - 99 mg/dL   BUN 17 7 - 25 mg/dL   Creat 9.56 2.13 - 0.86 mg/dL   eGFR 578 > OR = 60 IO/NGE/9.52W4   BUN/Creatinine Ratio SEE NOTE: 6 - 22 (calc)   Sodium 139 135 - 146 mmol/L   Potassium 4.4 3.5 - 5.3 mmol/L   Chloride 104 98 - 110 mmol/L   CO2 23 20 - 32 mmol/L   Calcium 9.9 8.6 - 10.3 mg/dL   Total Protein 7.4 6.1 - 8.1 g/dL   Albumin 4.9 3.6 - 5.1 g/dL   Globulin 2.5 1.9 - 3.7 g/dL (calc)   AG Ratio 2.0 1.0 - 2.5 (calc)   Total Bilirubin 0.6 0.2 - 1.2 mg/dL   Alkaline phosphatase (APISO) 60 36 - 130 U/L   AST 14 10 - 40 U/L   ALT 20 9 - 46 U/L      Assessment &  Plan:   Problem List Items Addressed This Visit   None Visit Diagnoses     Annual physical exam    -  Primary       Updated Health Maintenance information Reviewed recent lab results with patient Encouraged improvement to lifestyle with diet and exercise Goal of weight loss  Assessment and Plan    Reassurance he still has normal cholesterol. But LDL cholesterol increased from 65 to 84 over the past year, likely due to changes in diet and weight gain. No medications required at this time as LDL is still under 100. -Encouraged healthier diet and lifestyle modifications to lower LDL cholesterol.  Weight Gain Increase in weight from 198 lbs to 220 lbs over the past year. -Encouraged healthier diet  and lifestyle modifications to achieve weight loss.  Left Ankle Injury - RESOLVING Recent injury with swelling and pain, evaluated at emergency ortho with diagnosis of bone contusion. No fractures noted on imaging. -Continue RICE (Rest, Ice, Compression, Elevation) treatment. -Excused from work on 10/22/2023, may return to work on 10/23/2023.  General Health Maintenance -Declined vaccines today, including tetanus, flu shot, and COVID booster. -Annual physical and blood work completed, with overall good results. -Schedule follow-up in one year for next physical and blood work.       No orders of the defined types were placed in this encounter.     Follow up plan: Return in about 1 year (around 10/20/2024) for 1 year fasting lab only then 1 week later Annual Physical.  Fasting labs next time.  Saralyn Pilar, DO [facility] 10/21/2023, 11:21 AM

## 2024-03-12 ENCOUNTER — Encounter: Payer: Self-pay | Admitting: Family Medicine

## 2024-03-13 ENCOUNTER — Ambulatory Visit: Admitting: Internal Medicine

## 2024-03-13 VITALS — BP 130/84 | HR 83 | Ht 70.0 in | Wt 230.4 lb

## 2024-03-13 DIAGNOSIS — U071 COVID-19: Secondary | ICD-10-CM | POA: Diagnosis not present

## 2024-03-13 LAB — POC COVID19/FLU A&B COMBO
Covid Antigen, POC: POSITIVE — AB
Influenza A Antigen, POC: NEGATIVE
Influenza B Antigen, POC: NEGATIVE

## 2024-03-13 NOTE — Progress Notes (Signed)
 Subjective:    Patient ID: Rodney Woods, male    DOB: 05-01-1996, 28 y.o.   MRN: [id number]  HPI  Discussed the use of AI scribe software for clinical note transcription with the patient, who gave verbal consent to proceed.   Rodney Woods is a 28 year old male who presents with URI symptoms.  He has been experiencing symptoms for the past two days, including headache, sinus pressure, scratchy throat, loss of voice, chills, and a low-grade fever ranging from 100.5-100.54F. He also has a runny nose and a cough that worsens at night. No wheezing, nausea, vomiting, diarrhea, or body aches are present.  He is managing his symptoms with over-the-counter medications, specifically DayQuil and NyQuil. He notes that his current symptoms are milder compared to a previous COVID-19 infection, during which he required antibody treatment due to more severe symptoms.  He recently returned from a vacation at the beach and is an avid golfer, frequently playing in the local area.     He took a COVID test at home which was positive.   Review of Systems   Past Medical History:  Diagnosis Date   ADHD     Current Outpatient Medications  Medication Sig Dispense Refill   Multiple Vitamin (MULTIVITAMIN) capsule Take 1 capsule by mouth daily. (Patient not taking: Reported on 10/21/2023)     No current facility-administered medications for this visit.    No Known Allergies  Family History  Problem Relation Age of Onset   Hyperlipidemia Mother    Hypertension Mother    Hyperlipidemia Father    Diabetes Father    Heart disease Father    Valvular heart disease Maternal Grandmother    Heart disease Maternal Grandfather    Alzheimer's disease Paternal Grandfather    Cancer - Colon Paternal Grandfather 21   Cancer - Prostate Neg Hx     Social History   Socioeconomic History   Marital status: Married    Spouse name: Not on file   Number of children: Not on file   Years of education: Not  on file   Highest education level: Not on file  Occupational History   Not on file  Tobacco Use   Smoking status: Never   Smokeless tobacco: Never   Tobacco comments:    Never smoked  Vaping Use   Vaping status: Never Used  Substance and Sexual Activity   Alcohol use: Yes    Alcohol/week: 1.0 standard drink of alcohol    Types: 1 Standard drinks or equivalent per week   Drug use: Never   Sexual activity: Yes    Birth control/protection: None  Other Topics Concern   Not on file  Social History Narrative   Not on file   Social Drivers of Health   Financial Resource Strain: Not on file  Food Insecurity: Not on file  Transportation Needs: Not on file  Physical Activity: Not on file  Stress: Not on file  Social Connections: Not on file  Intimate Partner Violence: Not on file     Constitutional: Pt reports headache, fever and chills. Denies malaise, fatigue, or abrupt weight changes.  HEENT: Pt reports sinus pressure, runny nose and scratchy throat. Denies eye pain, eye redness, ear pain, ringing in the ears, wax buildup, nasal congestion, bloody nose. Respiratory: Pt reports cough. Denies difficulty breathing, shortness of breath.   Cardiovascular: Denies chest pain, chest tightness, palpitations or swelling in the hands or feet.  Gastrointestinal: Denies abdominal pain, bloating, constipation,  diarrhea or blood in the stool.  Musculoskeletal: Denies decrease in range of motion, difficulty with gait, muscle pain or joint pain and swelling.  Neurological: Denies dizziness, difficulty with memory, difficulty with speech or problems with balance and coordination.    No other specific complaints in a complete review of systems (except as listed in HPI above).      Objective:   Physical Exam BP 130/84 (BP Location: Left Arm, Patient Position: Sitting, Cuff Size: Normal)   Pulse 83   Ht 5\' 10"  (1.778 m)   Wt 230 lb 6.4 oz (104.5 kg)   SpO2 97%   BMI 33.06 kg/m   Wt  Readings from Last 3 Encounters:  10/21/23 221 lb (100.2 kg)  10/15/22 198 lb 3.2 oz (89.9 kg)  08/27/22 205 lb 9.6 oz (93.3 kg)    General: Appears his stated age, obese, in NAD. Skin: Warm, dry and intact.  HEENT: Head: normal shape and size, no sinus tenderness.; Eyes: sclera white, no icterus, conjunctiva pink, PERRLA and EOMs intact; Nose: mucosa pink and moist, septum midline; Throat/Mouth: Teeth present, mucosa pink and moist, no exudate, lesions or ulcerations noted.  Neck: No adenopathy noted. Cardiovascular: Normal rate and rhythm. S1,S2 noted.  No murmur, rubs or gallops noted.  Pulmonary/Chest: Normal effort and positive vesicular breath sounds. No respiratory distress. No wheezes, rales or ronchi noted.  Neurological: Alert and oriented.   BMET    Component Value Date/Time   NA 139 10/14/2023 0759   K 4.4 10/14/2023 0759   CL 104 10/14/2023 0759   CO2 23 10/14/2023 0759   GLUCOSE 82 10/14/2023 0759   BUN 17 10/14/2023 0759   CREATININE 1.01 10/14/2023 0759   CALCIUM 9.9 10/14/2023 0759    Lipid Panel     Component Value Date/Time   CHOL 145 10/14/2023 0759   TRIG 79 10/14/2023 0759   HDL 44 10/14/2023 0759   CHOLHDL 3.3 10/14/2023 0759   LDLCALC 84 10/14/2023 0759    CBC    Component Value Date/Time   WBC 6.1 10/14/2023 0759   RBC 5.74 10/14/2023 0759   HGB 15.2 10/14/2023 0759   HCT 46.5 10/14/2023 0759   PLT 265 10/14/2023 0759   MCV 81.0 10/14/2023 0759   MCH 26.5 (L) 10/14/2023 0759   MCHC 32.7 10/14/2023 0759   RDW 13.9 10/14/2023 0759   LYMPHSABS 1,879 10/14/2023 0759   EOSABS 98 10/14/2023 0759   BASOSABS 49 10/14/2023 0759    Hgb A1C Lab Results  Component Value Date   HGBA1C 5.5 10/14/2023            Assessment & Plan:   Assessment and Plan    COVID-19 infection Acute COVID-19 with mild symptoms. Previous infection required antibody treatment. Current symptoms mild, Paxlovid not recommended. - Symptomatic treatment with  acetaminophen and ibuprofen for fever. - Cetirizine or fluticasone for rhinorrhea and pharyngitis. - Quarantine for five days from symptom onset, wear mask to prevent transmission. - Work note provided for absence until Wednesday.       Follow-up with your PCP as previously scheduled Nicki Reaper, NP

## 2024-03-13 NOTE — Patient Instructions (Signed)

## 2024-10-19 ENCOUNTER — Other Ambulatory Visit: Payer: Self-pay | Admitting: Family Medicine

## 2024-10-19 DIAGNOSIS — Z13220 Encounter for screening for lipoid disorders: Secondary | ICD-10-CM

## 2024-10-19 DIAGNOSIS — Z0000 Encounter for general adult medical examination without abnormal findings: Secondary | ICD-10-CM

## 2024-10-19 DIAGNOSIS — Z131 Encounter for screening for diabetes mellitus: Secondary | ICD-10-CM

## 2024-10-20 ENCOUNTER — Other Ambulatory Visit: Payer: Self-pay

## 2024-10-20 LAB — CBC WITH DIFFERENTIAL/PLATELET
Absolute Lymphocytes: 2737 {cells}/uL (ref 850–3900)
Absolute Monocytes: 748 {cells}/uL (ref 200–950)
Basophils Absolute: 70 {cells}/uL (ref 0–200)
Basophils Relative: 0.8 %
Eosinophils Absolute: 282 {cells}/uL (ref 15–500)
Eosinophils Relative: 3.2 %
HCT: 44.4 % (ref 38.5–50.0)
Hemoglobin: 14.8 g/dL (ref 13.2–17.1)
MCH: 26.9 pg — ABNORMAL LOW (ref 27.0–33.0)
MCHC: 33.3 g/dL (ref 32.0–36.0)
MCV: 80.7 fL (ref 80.0–100.0)
MPV: 10.6 fL (ref 7.5–12.5)
Monocytes Relative: 8.5 %
Neutro Abs: 4963 {cells}/uL (ref 1500–7800)
Neutrophils Relative %: 56.4 %
Platelets: 277 Thousand/uL (ref 140–400)
RBC: 5.5 Million/uL (ref 4.20–5.80)
RDW: 13.8 % (ref 11.0–15.0)
Total Lymphocyte: 31.1 %
WBC: 8.8 Thousand/uL (ref 3.8–10.8)

## 2024-10-20 LAB — COMPREHENSIVE METABOLIC PANEL WITH GFR
AG Ratio: 2.2 (calc) (ref 1.0–2.5)
ALT: 29 U/L (ref 9–46)
AST: 18 U/L (ref 10–40)
Albumin: 4.8 g/dL (ref 3.6–5.1)
Alkaline phosphatase (APISO): 61 U/L (ref 36–130)
BUN: 14 mg/dL (ref 7–25)
CO2: 28 mmol/L (ref 20–32)
Calcium: 9.7 mg/dL (ref 8.6–10.3)
Chloride: 104 mmol/L (ref 98–110)
Creat: 0.93 mg/dL (ref 0.60–1.26)
Globulin: 2.2 g/dL (ref 1.9–3.7)
Glucose, Bld: 92 mg/dL (ref 65–99)
Potassium: 4.8 mmol/L (ref 3.5–5.3)
Sodium: 139 mmol/L (ref 135–146)
Total Bilirubin: 0.3 mg/dL (ref 0.2–1.2)
Total Protein: 7 g/dL (ref 6.1–8.1)
eGFR: 113 mL/min/1.73m2 (ref >=60)

## 2024-10-20 LAB — LIPID PANEL
Cholesterol: 139 mg/dL (ref <200)
HDL: 46 mg/dL (ref >=40)
LDL Cholesterol (Calc): 75 mg/dL
Non-HDL Cholesterol (Calc): 93 mg/dL (ref <130)
Total CHOL/HDL Ratio: 3 (calc) (ref <5.0)
Triglycerides: 93 mg/dL (ref <150)

## 2024-10-20 LAB — HEMOGLOBIN A1C
Hgb A1c MFr Bld: 5.3 % (ref <5.7)
Mean Plasma Glucose: 105 mg/dL
eAG (mmol/L): 5.8 mmol/L

## 2024-10-26 ENCOUNTER — Encounter: Payer: Self-pay | Admitting: Family Medicine

## 2024-10-27 ENCOUNTER — Other Ambulatory Visit: Payer: Self-pay | Admitting: Family Medicine

## 2024-10-27 ENCOUNTER — Ambulatory Visit: Admitting: Family Medicine

## 2024-10-27 VITALS — BP 120/78 | HR 74 | Ht 70.0 in | Wt 217.5 lb

## 2024-10-27 DIAGNOSIS — Z23 Encounter for immunization: Secondary | ICD-10-CM

## 2024-10-27 DIAGNOSIS — Z1329 Encounter for screening for other suspected endocrine disorder: Secondary | ICD-10-CM

## 2024-10-27 DIAGNOSIS — Z13220 Encounter for screening for lipoid disorders: Secondary | ICD-10-CM

## 2024-10-27 DIAGNOSIS — Z0000 Encounter for general adult medical examination without abnormal findings: Secondary | ICD-10-CM | POA: Diagnosis not present

## 2024-10-27 DIAGNOSIS — Z131 Encounter for screening for diabetes mellitus: Secondary | ICD-10-CM

## 2024-10-27 NOTE — Progress Notes (Signed)
 Subjective:    Patient ID: Rodney Woods, male    DOB: [birth year], 28 y.o.   MRN: [id number]  Rodney Woods is a 28 y.o. male presenting on 10/27/2024 for Annual Exam   HPI  Discussed the use of AI scribe software for clinical note transcription with the patient, who gave verbal consent to proceed.  History of Present Illness   Rodney Woods is a 28 year old male who presents for an annual physical exam. He is accompanied by his partner, who provides additional information during the visit.  Immunization status - Received TDAP vaccine today due to lack of tetanus vaccination in over ten years. - No copy of immunization record available; plans to check records for Hepatitis B vaccination status.  Obesity BMI >31 Weight fluctuation and dietary habits - Weight fluctuated from 221 pounds last October to 230 pounds in March, currently 217 pounds. - Practices intermittent fasting based on work schedule. - Aims to eliminate ice cream from diet to support weight loss.  Metabolic and laboratory findings - Recent blood work showed A1c level of 5.3, improved, normal range. - Cholesterol levels have improved compared to last year.  No caffeine Occasional alcohol intake, rare. Flushing reaction  Mild abnormal CBC with MCH, similar to prior   Fam history DM HLD HTN CAD       Health Maintenance:   Flu Shot declined  Tdap today     10/27/2024    6:50 PM 03/13/2024    2:02 PM 10/21/2023   10:50 AM  Depression screen PHQ 2/9  Decreased Interest 0 0 0  Down, Depressed, Hopeless 0 0 0  PHQ - 2 Score 0 0 0  Altered sleeping   0  Tired, decreased energy   0  Change in appetite   0  Feeling bad or failure about yourself    0  Trouble concentrating   0  Moving slowly or fidgety/restless   0  Suicidal thoughts   0  PHQ-9 Score   0  Difficult doing work/chores   Not difficult at all       03/13/2024    2:02 PM 10/21/2023   10:51 AM  GAD 7 : Generalized Anxiety Score   Nervous, Anxious, on Edge 0 0  Control/stop worrying 0 0  Worry too much - different things 0 0  Trouble relaxing 0 0  Restless 0 0  Easily annoyed or irritable 0 0  Afraid - awful might happen 0 0  Total GAD 7 Score 0 0  Anxiety Difficulty Not difficult at all      Past Medical History:  Diagnosis Date   ADHD    Past Surgical History:  Procedure Laterality Date   TONSILLECTOMY     WISDOM TOOTH EXTRACTION  2017   Social History   Socioeconomic History   Marital status: Married    Spouse name: Not on file   Number of children: Not on file   Years of education: Not on file   Highest education level: Some college, no degree  Occupational History   Not on file  Tobacco Use   Smoking status: Never   Smokeless tobacco: Never   Tobacco comments:    Never smoked  Vaping Use   Vaping status: Never Used  Substance and Sexual Activity   Alcohol use: Yes    Alcohol/week: 1.0 standard drink of alcohol    Types: 1 Standard drinks or equivalent per week   Drug use: Never  Sexual activity: Yes    Birth control/protection: None  Other Topics Concern   Not on file  Social History Narrative   Not on file   Social Drivers of Health   Financial Resource Strain: Low Risk  (10/27/2024)   Overall Financial Resource Strain (CARDIA)    Difficulty of Paying Living Expenses: Not hard at all  Food Insecurity: No Food Insecurity (10/27/2024)   Hunger Vital Sign    Worried About Running Out of Food in the Last Year: Never true    Ran Out of Food in the Last Year: Never true  Transportation Needs: No Transportation Needs (10/27/2024)   PRAPARE - Administrator, Civil Service (Medical): No    Lack of Transportation (Non-Medical): No  Physical Activity: Insufficiently Active (10/27/2024)   Exercise Vital Sign    Days of Exercise per Week: 3 days    Minutes of Exercise per Session: 30 min  Stress: No Stress Concern Present (10/27/2024)   [employer] of  Occupational Health - Occupational Stress Questionnaire    Feeling of Stress: Not at all  Social Connections: Socially Integrated (10/27/2024)   Social Connection and Isolation Panel    Frequency of Communication with Friends and Family: More than three times a week    Frequency of Social Gatherings with Friends and Family: More than three times a week    Attends Religious Services: More than 4 times per year    Active Member of [employer] or Organizations: Yes    Attends Engineer, Structural: More than 4 times per year    Marital Status: Married  Catering Manager Violence: Not on file   Family History  Problem Relation Age of Onset   Hyperlipidemia Mother    Hypertension Mother    Hyperlipidemia Father    Diabetes Father    Heart disease Father    Valvular heart disease Maternal Grandmother    Heart disease Maternal Grandfather    Alzheimer's disease Paternal Grandfather    Cancer - Colon Paternal Grandfather 81   Cancer - Prostate Neg Hx    Current Outpatient Medications on File Prior to Visit  Medication Sig   Multiple Vitamin (MULTIVITAMIN) capsule Take 1 capsule by mouth daily.   No current facility-administered medications on file prior to visit.    Review of Systems  Constitutional:  Negative for activity change, appetite change, chills, diaphoresis, fatigue and fever.  HENT:  Negative for congestion and hearing loss.   Eyes:  Negative for visual disturbance.  Respiratory:  Negative for cough, chest tightness, shortness of breath and wheezing.   Cardiovascular:  Negative for chest pain, palpitations and leg swelling.  Gastrointestinal:  Negative for abdominal pain, constipation, diarrhea, nausea and vomiting.  Genitourinary:  Negative for dysuria, frequency and hematuria.  Musculoskeletal:  Negative for arthralgias and neck pain.  Skin:  Negative for rash.  Neurological:  Negative for dizziness, weakness, light-headedness, numbness and headaches.  Hematological:   Negative for adenopathy.  Psychiatric/Behavioral:  Negative for behavioral problems, dysphoric mood and sleep disturbance.    Per HPI unless specifically indicated above     Objective:    BP 120/78 (BP Location: Left Arm, Patient Position: Sitting, Cuff Size: Normal)   Pulse 74   Ht 5' 10 (1.778 m)   Wt 217 lb 8 oz (98.7 kg)   SpO2 96%   BMI 31.21 kg/m   Wt Readings from Last 3 Encounters:  10/27/24 217 lb 8 oz (98.7 kg)  03/13/24 230 lb 6.4  oz (104.5 kg)  10/21/23 221 lb (100.2 kg)    Physical Exam Vitals and nursing note reviewed.  Constitutional:      General: He is not in acute distress.    Appearance: He is well-developed. He is not diaphoretic.     Comments: Well-appearing, comfortable, cooperative  HENT:     Head: Normocephalic and atraumatic.  Eyes:     General:        Right eye: No discharge.        Left eye: No discharge.     Conjunctiva/sclera: Conjunctivae normal.     Pupils: Pupils are equal, round, and reactive to light.  Neck:     Thyroid: No thyromegaly.     Vascular: No carotid bruit.  Cardiovascular:     Rate and Rhythm: Normal rate and regular rhythm.     Pulses: Normal pulses.     Heart sounds: Normal heart sounds. No murmur heard. Pulmonary:     Effort: Pulmonary effort is normal. No respiratory distress.     Breath sounds: Normal breath sounds. No wheezing or rales.  Abdominal:     General: Bowel sounds are normal. There is no distension.     Palpations: Abdomen is soft. There is no mass.     Tenderness: There is no abdominal tenderness.  Musculoskeletal:        General: No tenderness. Normal range of motion.     Cervical back: Normal range of motion and neck supple.     Right lower leg: No edema.     Left lower leg: No edema.     Comments: Upper / Lower Extremities: - Normal muscle tone, strength bilateral upper extremities 5/5, lower extremities 5/5  Lymphadenopathy:     Cervical: No cervical adenopathy.  Skin:    General: Skin is  warm and dry.     Findings: No erythema or rash.  Neurological:     Mental Status: He is alert and oriented to person, place, and time.     Comments: Distal sensation intact to light touch all extremities  Psychiatric:        Mood and Affect: Mood normal.        Behavior: Behavior normal.        Thought Content: Thought content normal.     Comments: Well groomed, good eye contact, normal speech and thoughts     Results for orders placed or performed in visit on 10/19/24  Lipid panel   Collection Time: 10/20/24  8:32 AM  Result Value Ref Range   Cholesterol 139 <200 mg/dL   HDL 46 > OR = 40 mg/dL   Triglycerides 93 <849 mg/dL   LDL Cholesterol (Calc) 75 mg/dL (calc)   Total CHOL/HDL Ratio 3.0 <5.0 (calc)   Non-HDL Cholesterol (Calc) 93 <869 mg/dL (calc)  Hemoglobin J8r   Collection Time: 10/20/24  8:32 AM  Result Value Ref Range   Hgb A1c MFr Bld 5.3 <5.7 %   Mean Plasma Glucose 105 mg/dL   eAG (mmol/L) 5.8 mmol/L  CBC with Differential/Platelet   Collection Time: 10/20/24  8:32 AM  Result Value Ref Range   WBC 8.8 3.8 - 10.8 Thousand/uL   RBC 5.50 4.20 - 5.80 Million/uL   Hemoglobin 14.8 13.2 - 17.1 g/dL   HCT 55.5 61.4 - 49.9 %   MCV 80.7 80.0 - 100.0 fL   MCH 26.9 (L) 27.0 - 33.0 pg   MCHC 33.3 32.0 - 36.0 g/dL   RDW 86.1 88.9 - 84.9 %  Platelets 277 140 - 400 Thousand/uL   MPV 10.6 7.5 - 12.5 fL   Neutro Abs 4,963 1,500 - 7,800 cells/uL   Absolute Lymphocytes 2,737 850 - 3,900 cells/uL   Absolute Monocytes 748 200 - 950 cells/uL   Eosinophils Absolute 282 15 - 500 cells/uL   Basophils Absolute 70 0 - 200 cells/uL   Neutrophils Relative % 56.4 %   Total Lymphocyte 31.1 %   Monocytes Relative 8.5 %   Eosinophils Relative 3.2 %   Basophils Relative 0.8 %  Comprehensive metabolic panel with GFR   Collection Time: 10/20/24  8:32 AM  Result Value Ref Range   Glucose, Bld 92 65 - 99 mg/dL   BUN 14 7 - 25 mg/dL   Creat 9.06 9.39 - 8.73 mg/dL   eGFR 886 > OR = 60  fO/fpw/8.26f7   BUN/Creatinine Ratio SEE NOTE: 6 - 22 (calc)   Sodium 139 135 - 146 mmol/L   Potassium 4.8 3.5 - 5.3 mmol/L   Chloride 104 98 - 110 mmol/L   CO2 28 20 - 32 mmol/L   Calcium 9.7 8.6 - 10.3 mg/dL   Total Protein 7.0 6.1 - 8.1 g/dL   Albumin 4.8 3.6 - 5.1 g/dL   Globulin 2.2 1.9 - 3.7 g/dL (calc)   AG Ratio 2.2 1.0 - 2.5 (calc)   Total Bilirubin 0.3 0.2 - 1.2 mg/dL   Alkaline phosphatase (APISO) 61 36 - 130 U/L   AST 18 10 - 40 U/L   ALT 29 9 - 46 U/L      Assessment & Plan:   Problem List Items Addressed This Visit   None Visit Diagnoses       Annual physical exam    -  Primary     Need for diphtheria-tetanus-pertussis (Tdap) vaccine       Relevant Orders   Tdap vaccine greater than or equal to 7yo IM (Completed)        Updated Health Maintenance information Reviewed recent lab results with patient Encouraged improvement to lifestyle with diet and exercise Goal of weight loss  Adult Wellness Visit Routine wellness visit with normal blood work. A1c at 5.3 indicates good glycemic control. Cholesterol levels normal and improved. Blood pressure normalized on recheck. - Schedule next annual wellness visit for Monday morning, earliest available. - Provide after-visit summary via MyChart.  Weight management Weight decreased from 230 lbs to 217 lbs. Engaging in intermittent fasting and plans to reduce sugar intake. Discussed potential benefit of working with a nutritionist through the [employer], covered by insurance. - Encourage continued weight management efforts, including intermittent fasting and reducing sugar intake. - Explore working with a nutritionist through the [employer] for personalized dietary guidance.  Immunization: Tdap administration and Hepatitis B immunization record review Tdap due as last dose was over ten years ago. Hepatitis B vaccination status uncertain; likely completed in childhood. Options include obtaining records, checking immunity  via blood test, or receiving a booster. - Administer Tdap vaccine today. Good for 10 years - Request immunization records to verify Hepatitis B vaccination status. - Consider Hepatitis B booster if records or immunity are not confirmed.  Alcohol-induced flushing Reports flushing, redness, and sweating after infrequent alcohol consumption. Symptoms resolve within an hour. - Monitor alcohol consumption and symptoms.        Orders Placed This Encounter  Procedures   Tdap vaccine greater than or equal to 7yo IM    No orders of the defined types were placed in this  encounter.    Follow up plan: Return for 1 year fasting lab > 1 week later Annual Physical.  10/25/2025   Marsa Officer, DO [facility] 10/27/2024, 3:55 PM

## 2024-10-27 NOTE — Patient Instructions (Addendum)
 Thank you for coming to the office today.  Please send us  a copy of your full immunization record, we are looking to update Hepatitis B vaccine, should have had 3 doses in childhood, or we can boost or check blood level.  Tdap today,. Good for 10 years.  [email or web address]  Check into this for insurance based / covered nutritionist   DUE for FASTING BLOOD WORK (no food or drink after midnight before the lab appointment, only water or coffee without cream/sugar on the morning of)  SCHEDULE Lab Only visit in the morning at the clinic for lab draw in 1 YEAR  - Make sure Lab Only appointment is at about 1 week before your next appointment, so that results will be available  For Lab Results, once available within 2-3 days of blood draw, you can can log in to MyChart online to view your results and a brief explanation. Also, we can discuss results at next follow-up visit.   Please schedule a Follow-up Appointment to: Return for 1 year fasting lab > 1 week later Annual Physical.  If you have any other questions or concerns, please feel free to call the office or send a message through MyChart. You may also schedule an earlier appointment if necessary.  Additionally, you may be receiving a survey about your experience at our office within a few days to 1 week by e-mail or mail. We value your feedback.  Marsa Officer, DO [facility] [place], [place]

## 2025-10-25 ENCOUNTER — Other Ambulatory Visit

## 2025-11-01 ENCOUNTER — Encounter: Admitting: Family Medicine
